# Patient Record
Sex: Male | Born: 1990 | Race: Black or African American | Hispanic: No | Marital: Single | Smoking: Current every day smoker
Health system: Southern US, Community
[De-identification: ages and names within clinical notes are randomized; demographics above are authoritative.]

## PROBLEM LIST (undated history)

## (undated) DIAGNOSIS — J45909 Unspecified asthma, uncomplicated: Secondary | ICD-10-CM

---
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
## 2009-09-06 ENCOUNTER — Emergency Department (HOSPITAL_COMMUNITY): Admission: EM | Admit: 2009-09-06 | Discharge: 2009-09-06 | Payer: Self-pay | Admitting: Emergency Medicine

## 2012-03-03 ENCOUNTER — Emergency Department (HOSPITAL_COMMUNITY)
Admission: EM | Admit: 2012-03-03 | Discharge: 2012-03-03 | Disposition: A | Discharge status: Home / Self Care | Payer: Self-pay | Source: Home / Self Care

## 2014-05-07 ENCOUNTER — Encounter (HOSPITAL_COMMUNITY): Payer: Self-pay | Admitting: Emergency Medicine

## 2014-05-07 ENCOUNTER — Emergency Department (HOSPITAL_COMMUNITY)
Admission: EM | Admit: 2014-05-07 | Discharge: 2014-05-07 | Disposition: A | Discharge status: Home / Self Care | Payer: No Typology Code available for payment source | Attending: Emergency Medicine | Admitting: Emergency Medicine

## 2014-05-07 DIAGNOSIS — Y92410 Unspecified street and highway as the place of occurrence of the external cause: Secondary | ICD-10-CM | POA: Insufficient documentation

## 2014-05-07 DIAGNOSIS — IMO0002 Reserved for concepts with insufficient information to code with codable children: Secondary | ICD-10-CM | POA: Insufficient documentation

## 2014-05-07 DIAGNOSIS — F17200 Nicotine dependence, unspecified, uncomplicated: Secondary | ICD-10-CM | POA: Insufficient documentation

## 2014-05-07 DIAGNOSIS — Y9389 Activity, other specified: Secondary | ICD-10-CM | POA: Insufficient documentation

## 2014-05-07 DIAGNOSIS — M542 Cervicalgia: Secondary | ICD-10-CM

## 2014-05-07 DIAGNOSIS — S199XXA Unspecified injury of neck, initial encounter: Principal | ICD-10-CM

## 2014-05-07 DIAGNOSIS — J45909 Unspecified asthma, uncomplicated: Secondary | ICD-10-CM | POA: Insufficient documentation

## 2014-05-07 DIAGNOSIS — S0993XA Unspecified injury of face, initial encounter: Secondary | ICD-10-CM | POA: Insufficient documentation

## 2014-05-07 DIAGNOSIS — S0990XA Unspecified injury of head, initial encounter: Secondary | ICD-10-CM | POA: Insufficient documentation

## 2014-05-07 HISTORY — DX: Unspecified asthma, uncomplicated: J45.909

## 2014-05-07 MED ORDER — CYCLOBENZAPRINE HCL 10 MG PO TABS: 10.0000 mg | ORAL_TABLET | Freq: Two times a day (BID) | ORAL | Status: AC | PRN

## 2014-05-07 MED ORDER — IBUPROFEN 800 MG PO TABS: 800.0000 mg | ORAL_TABLET | Freq: Three times a day (TID) | ORAL | Status: AC

## 2014-05-07 MED ORDER — IBUPROFEN 400 MG PO TABS
800.0000 mg | ORAL_TABLET | Freq: Once | ORAL | Status: AC
  Administered 2014-05-07: 800 mg via ORAL
  Filled 2014-05-07: qty 2
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
## 2014-05-07 NOTE — Discharge Instructions (Signed)
Take ibuprofen as needed for pain. Take Flexeril as needed for muscle spasm. Refer to attached documents for more information. Return to the ED with worsening or concerning symptoms.  °

## 2014-05-09 NOTE — ED Provider Notes (Signed)
Medical screening examination/treatment/procedure(s) were performed by non-physician practitioner and as supervising physician I was immediately available for consultation/collaboration.   EKG Interpretation None        Kathleen M McManus, DO 05/09/14 1448 

## 2014-05-27 ENCOUNTER — Ambulatory Visit
Admission: RE | Admit: 2014-05-27 | Discharge: 2014-05-27 | Disposition: A | Discharge status: Home / Self Care | Payer: No Typology Code available for payment source | Source: Ambulatory Visit | Attending: Family Medicine | Admitting: Family Medicine

## 2014-05-27 ENCOUNTER — Ambulatory Visit: Payer: No Typology Code available for payment source | Attending: Family Medicine | Admitting: Physical Therapy

## 2014-05-27 ENCOUNTER — Other Ambulatory Visit: Payer: Self-pay | Admitting: Family Medicine

## 2014-05-27 DIAGNOSIS — S242XXA Injury of nerve root of thoracic spine, initial encounter: Secondary | ICD-10-CM

## 2014-05-27 DIAGNOSIS — S145XXA Injury of cervical sympathetic nerves, initial encounter: Secondary | ICD-10-CM

## 2014-05-27 DIAGNOSIS — M545 Low back pain, unspecified: Secondary | ICD-10-CM | POA: Insufficient documentation

## 2014-05-27 DIAGNOSIS — M542 Cervicalgia: Secondary | ICD-10-CM | POA: Insufficient documentation

## 2014-06-01 ENCOUNTER — Ambulatory Visit: Payer: No Typology Code available for payment source | Admitting: Physical Therapy

## 2014-06-02 ENCOUNTER — Ambulatory Visit: Payer: No Typology Code available for payment source | Admitting: Physical Therapy

## 2014-06-09 ENCOUNTER — Ambulatory Visit: Payer: No Typology Code available for payment source | Admitting: Physical Therapy

## 2014-06-10 ENCOUNTER — Ambulatory Visit: Payer: No Typology Code available for payment source | Admitting: Physical Therapy

## 2014-06-15 ENCOUNTER — Ambulatory Visit: Payer: No Typology Code available for payment source | Admitting: Physical Therapy

## 2014-06-22 ENCOUNTER — Ambulatory Visit: Payer: Self-pay | Attending: Family Medicine | Admitting: Physical Therapy

## 2014-06-22 DIAGNOSIS — M545 Low back pain, unspecified: Secondary | ICD-10-CM | POA: Insufficient documentation

## 2014-06-22 DIAGNOSIS — M542 Cervicalgia: Secondary | ICD-10-CM | POA: Insufficient documentation

## 2014-06-24 ENCOUNTER — Ambulatory Visit: Payer: Self-pay | Admitting: Physical Therapy

## 2014-06-29 ENCOUNTER — Ambulatory Visit: Payer: Self-pay | Admitting: Physical Therapy

## 2014-07-01 ENCOUNTER — Ambulatory Visit: Payer: Self-pay | Admitting: Physical Therapy

## 2014-07-06 ENCOUNTER — Ambulatory Visit: Payer: Self-pay | Admitting: Physical Therapy

## 2014-07-07 ENCOUNTER — Ambulatory Visit: Payer: Self-pay | Admitting: Physical Therapy
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
## 2016-03-29 ENCOUNTER — Emergency Department (HOSPITAL_COMMUNITY): Payer: No Typology Code available for payment source

## 2016-03-29 ENCOUNTER — Emergency Department (HOSPITAL_COMMUNITY)
Admission: EM | Admit: 2016-03-29 | Discharge: 2016-03-30 | Disposition: A | Discharge status: Home / Self Care | Payer: No Typology Code available for payment source | Attending: Emergency Medicine | Admitting: Emergency Medicine

## 2016-03-29 DIAGNOSIS — Y998 Other external cause status: Secondary | ICD-10-CM | POA: Diagnosis not present

## 2016-03-29 DIAGNOSIS — J45909 Unspecified asthma, uncomplicated: Secondary | ICD-10-CM | POA: Insufficient documentation

## 2016-03-29 DIAGNOSIS — F17200 Nicotine dependence, unspecified, uncomplicated: Secondary | ICD-10-CM | POA: Diagnosis not present

## 2016-03-29 DIAGNOSIS — S4991XA Unspecified injury of right shoulder and upper arm, initial encounter: Secondary | ICD-10-CM | POA: Insufficient documentation

## 2016-03-29 DIAGNOSIS — S29002A Unspecified injury of muscle and tendon of back wall of thorax, initial encounter: Secondary | ICD-10-CM | POA: Diagnosis not present

## 2016-03-29 DIAGNOSIS — S3992XA Unspecified injury of lower back, initial encounter: Secondary | ICD-10-CM | POA: Diagnosis present

## 2016-03-29 DIAGNOSIS — Y92410 Unspecified street and highway as the place of occurrence of the external cause: Secondary | ICD-10-CM | POA: Insufficient documentation

## 2016-03-29 DIAGNOSIS — M5441 Lumbago with sciatica, right side: Secondary | ICD-10-CM | POA: Insufficient documentation

## 2016-03-29 DIAGNOSIS — Y9389 Activity, other specified: Secondary | ICD-10-CM | POA: Insufficient documentation

## 2016-03-29 DIAGNOSIS — Z791 Long term (current) use of non-steroidal anti-inflammatories (NSAID): Secondary | ICD-10-CM | POA: Diagnosis not present

## 2016-03-29 MED ORDER — TRAMADOL HCL 50 MG PO TABS: 50.0000 mg | ORAL_TABLET | Freq: Four times a day (QID) | ORAL | Status: DC | PRN

## 2016-03-29 MED ORDER — CYCLOBENZAPRINE HCL 10 MG PO TABS: 5.0000 mg | ORAL_TABLET | Freq: Two times a day (BID) | ORAL | Status: AC | PRN

## 2016-03-29 MED ORDER — TRAMADOL HCL 50 MG PO TABS
50.0000 mg | ORAL_TABLET | Freq: Once | ORAL | Status: AC
  Administered 2016-03-29: 50 mg via ORAL
  Filled 2016-03-29: qty 1

## 2016-03-29 MED ORDER — CYCLOBENZAPRINE HCL 10 MG PO TABS
5.0000 mg | ORAL_TABLET | Freq: Once | ORAL | Status: AC
  Administered 2016-03-29: 5 mg via ORAL
  Filled 2016-03-29: qty 1

## 2016-03-29 MED ORDER — NAPROXEN 500 MG PO TABS: 500.0000 mg | ORAL_TABLET | Freq: Two times a day (BID) | ORAL | Status: AC
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
## 2016-03-29 NOTE — ED Notes (Signed)
Pt ambulated to Xray 

## 2016-03-29 NOTE — ED Notes (Signed)
Pt instructed to change for XR
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
## 2018-11-02 ENCOUNTER — Emergency Department (HOSPITAL_COMMUNITY): Payer: Self-pay

## 2018-11-02 ENCOUNTER — Emergency Department (HOSPITAL_COMMUNITY)
Admission: EM | Admit: 2018-11-02 | Discharge: 2018-11-02 | Disposition: A | Discharge status: Home / Self Care | Payer: Self-pay | Attending: Emergency Medicine | Admitting: Emergency Medicine

## 2018-11-02 ENCOUNTER — Ambulatory Visit (HOSPITAL_COMMUNITY)
Admission: EM | Admit: 2018-11-02 | Discharge: 2018-11-02 | Discharge status: Against Medical Advice | Payer: No Typology Code available for payment source

## 2018-11-02 ENCOUNTER — Encounter (HOSPITAL_COMMUNITY): Payer: Self-pay

## 2018-11-02 DIAGNOSIS — N433 Hydrocele, unspecified: Secondary | ICD-10-CM | POA: Insufficient documentation

## 2018-11-02 DIAGNOSIS — J45909 Unspecified asthma, uncomplicated: Secondary | ICD-10-CM | POA: Insufficient documentation

## 2018-11-02 DIAGNOSIS — I861 Scrotal varices: Secondary | ICD-10-CM | POA: Insufficient documentation

## 2018-11-02 DIAGNOSIS — F17210 Nicotine dependence, cigarettes, uncomplicated: Secondary | ICD-10-CM | POA: Insufficient documentation

## 2018-11-02 DIAGNOSIS — N5089 Other specified disorders of the male genital organs: Secondary | ICD-10-CM

## 2018-11-02 MED ORDER — TRAMADOL HCL 50 MG PO TABS: 50.0000 mg | ORAL_TABLET | Freq: Four times a day (QID) | ORAL | 0 refills | Status: AC | PRN

## 2018-11-02 MED ORDER — MORPHINE SULFATE (PF) 4 MG/ML IV SOLN
4.0000 mg | Freq: Once | INTRAVENOUS | Status: AC
  Administered 2018-11-02: 4 mg via INTRAVENOUS
  Filled 2018-11-02: qty 1

## 2018-11-02 NOTE — ED Triage Notes (Addendum)
Patient c/o of left testical swelling. Patient feels as if they are twisted. Patient noticed swelling yesterday that has gotten worse.    7/10 tightness/pressure   A/Ox4.  Ambulatory in triage.

## 2018-11-02 NOTE — Discharge Instructions (Signed)
No heavy lifting for 2-3 days   Take motrin for pain  Take tramadol for severe pain   See urology for follow up  Return to ER if you have worse scrotal pain or swelling, abdominal pain, vomiting, fever

## 2018-11-02 NOTE — ED Provider Notes (Signed)
[facility] Provider Note   CSN: [id number] Arrival date & time: 11/02/18  1313     History   Chief Complaint Chief Complaint  Patient presents with  . Groin Swelling    HPI [facility] is a 27 y.o. male history of asthma here presenting with left testicular pain.  Patient states that he had left testicular pain that started gradually sometime yesterday.  He did left up some heavy equipment at work.  Patient states that overnight it got worse and is very swollen.  Denies any pain with urination or hematuria or dysuria.  Patient states that he is sexually active with 1 male partner and was using condoms about 4 days ago.  Patient states that his son had testicular torsion previously but has no personal history of testicular torsion.  He is concerned that he may have testicular torsion currently. Denies any flank pain or hx of kidney stones. Denies any penile discharge   The history is provided by the patient.    Past Medical History:  Diagnosis Date  . Asthma     There are no active problems to display for this patient.   History reviewed. No pertinent surgical history.      Home Medications    Prior to Admission medications   Medication Sig Start Date End Date Taking? Authorizing Provider  Naproxen Sodium (ALEVE) 220 MG CAPS Take 440 mg by mouth daily as needed (pain).   Yes [provider]  cyclobenzaprine (FLEXERIL) 10 MG tablet Take 1 tablet (10 mg total) by mouth 2 (two) times daily as needed for muscle spasms. Patient not taking: Reported on 11/02/2018 05/07/14   Troy Beck, PA-C  cyclobenzaprine (FLEXERIL) 10 MG tablet Take 0.5-1 tablets (5-10 mg total) by mouth 2 (two) times daily as needed for muscle spasms. Patient not taking: Reported on 11/02/2018 03/29/16   Troy Pel, PA-C  ibuprofen (ADVIL,MOTRIN) 800 MG tablet Take 1 tablet (800 mg total) by mouth 3 (three) times daily. Patient not taking:  Reported on 11/02/2018 05/07/14   Troy Beck, PA-C  naproxen (NAPROSYN) 500 MG tablet Take 1 tablet (500 mg total) by mouth 2 (two) times daily. Patient not taking: Reported on 11/02/2018 03/29/16   Troy Pel, PA-C  traMADol (ULTRAM) 50 MG tablet Take 1 tablet (50 mg total) by mouth every 6 (six) hours as needed. Patient not taking: Reported on 11/02/2018 03/29/16   Troy Pel, PA-C    Family History History reviewed. No pertinent family history.  Social History Social History   Tobacco Use  . Smoking status: Current Every Day Smoker  . Smokeless tobacco: Never Used  Substance Use Topics  . Alcohol use: Yes    Comment: socially  . Drug use: Yes    Types: Marijuana     Allergies   Peach [prunus persica]   Review of Systems Review of Systems  Genitourinary: Positive for scrotal swelling.  All other systems reviewed and are negative.    Physical Exam Updated Vital Signs BP 121/70   Pulse 90   Temp 98 F (36.7 C)   Resp 20   SpO2 94%   Physical Exam Vitals signs and nursing note reviewed.  Constitutional:      Comments: Uncomfortable, anxious   HENT:     Head: Normocephalic.     Nose: Nose normal.     Mouth/Throat:     Mouth: Mucous membranes are moist.  Eyes:     Extraocular Movements: Extraocular movements intact.  Pupils: Pupils are equal, round, and reactive to light.  Neck:     Musculoskeletal: Normal range of motion.  Cardiovascular:     Rate and Rhythm: Normal rate.  Pulmonary:     Effort: Pulmonary effort is normal.     Breath sounds: Normal breath sounds.  Abdominal:     General: Abdomen is flat. Bowel sounds are normal.     Palpations: Abdomen is soft.  Genitourinary:    Penis: Normal.      Comments: L testicle swollen and mildly tender. ? Varicocele, no obvious inguinal hernia. Difficult to assess cremasteric reflex due to swelling  Musculoskeletal: Normal range of motion.  Skin:    General: Skin is warm.     Capillary  Refill: Capillary refill takes less than 2 seconds.  Neurological:     General: No focal deficit present.     Mental Status: He is alert and oriented to person, place, and time. Mental status is at baseline.  Psychiatric:        Mood and Affect: Mood normal.      ED Treatments / Results  Labs (all labs ordered are listed, but only abnormal results are displayed) Labs Reviewed - No data to display  EKG None  Radiology [place] Scrotum W/doppler  Result Date: 11/02/2018 CLINICAL DATA:  Left testicular pain. EXAM: SCROTAL ULTRASOUND DOPPLER ULTRASOUND OF THE TESTICLES TECHNIQUE: Complete ultrasound examination of the testicles, epididymis, and other scrotal structures was performed. Color and spectral Doppler ultrasound were also utilized to evaluate blood flow to the testicles. COMPARISON:  None. FINDINGS: Right testicle Measurements: 4.7 x 2.1 x 2.9 cm. No mass or microlithiasis visualized. Left testicle Measurements: 4.3 x 2.5 x 2.2 cm. No mass or microlithiasis visualized. Right epididymis:  Normal in size and appearance. Left epididymis:  Normal in size and appearance. Hydrocele:  Small bilateral hydroceles are noted. Varicocele:  Mild bilateral varicoceles are noted. Pulsed Doppler interrogation of both testes demonstrates normal low resistance arterial and venous waveforms bilaterally. IMPRESSION: No evidence of testicular mass or torsion. Small bilateral hydroceles are noted. Mild bilateral varicoceles are noted. Electronically Signed   By: [facility]  Green Jr, M.D.   On: 11/02/2018 15:17    Procedures Procedures (including critical care time)  Medications Ordered in ED Medications  morphine 4 MG/ML injection 4 mg (4 mg Intravenous Given 11/02/18 1355)     Initial Impression / Assessment and Plan / ED Course  I have reviewed the triage vital signs and the nursing notes.  Pertinent labs & imaging results that were available during my care of the patient were reviewed by me and  considered in my medical decision making (see chart for details).    Lia HoppingBrandon F Strong is a 27 y.o. male here with L testicular pain and swelling. Consider torsion vs varicocele (given recent heavy lifting), no obvious inguinal hernia palpable. Will get scrotal US to r/o torsion    4:03 PM US showed no torsion. There is bilateral hydroceles and varicoceles. I think that is likely what is causing his pain. Will have him not lift up heavy items, prn motrin, vicodin for pain, urology follow up   Final Clinical Impressions(s) / ED Diagnoses   Final diagnoses:  None    ED Discharge Orders    None       Charlynne PanderYao, Dennis Killilea Hsienta, MD 11/02/18 [phone]

## 2018-11-02 NOTE — ED Notes (Signed)
Patient transported to Ultrasound 

## 2018-11-11 ENCOUNTER — Emergency Department (HOSPITAL_COMMUNITY): Payer: Self-pay

## 2018-11-11 ENCOUNTER — Emergency Department (HOSPITAL_COMMUNITY)
Admission: EM | Admit: 2018-11-11 | Discharge: 2018-11-11 | Disposition: A | Discharge status: Home / Self Care | Payer: Self-pay | Attending: Emergency Medicine | Admitting: Emergency Medicine

## 2018-11-11 ENCOUNTER — Encounter (HOSPITAL_COMMUNITY): Payer: Self-pay | Admitting: Emergency Medicine

## 2018-11-11 DIAGNOSIS — J45909 Unspecified asthma, uncomplicated: Secondary | ICD-10-CM | POA: Insufficient documentation

## 2018-11-11 DIAGNOSIS — N451 Epididymitis: Secondary | ICD-10-CM | POA: Insufficient documentation

## 2018-11-11 DIAGNOSIS — N452 Orchitis: Secondary | ICD-10-CM | POA: Insufficient documentation

## 2018-11-11 DIAGNOSIS — F17200 Nicotine dependence, unspecified, uncomplicated: Secondary | ICD-10-CM | POA: Insufficient documentation

## 2018-11-11 LAB — BASIC METABOLIC PANEL
ANION GAP: 13 (ref 5–15)
BUN: 10 mg/dL (ref 6–20)
CO2: 22 mmol/L (ref 22–32)
Calcium: 9.4 mg/dL (ref 8.9–10.3)
Chloride: 98 mmol/L (ref 98–111)
Creatinine, Ser: 1.13 mg/dL (ref 0.61–1.24)
GFR calc Af Amer: >60 mL/min (ref >60)
GFR calc non Af Amer: >60 mL/min (ref >60)
GLUCOSE: 108 mg/dL — AB (ref 70–99)
POTASSIUM: 3.8 mmol/L (ref 3.5–5.1)
Sodium: 133 mmol/L — ABNORMAL LOW (ref 135–145)

## 2018-11-11 LAB — CBC
HEMATOCRIT: 47.4 % (ref 39.0–52.0)
HEMOGLOBIN: 15.6 g/dL (ref 13.0–17.0)
MCH: 30.1 pg (ref 26.0–34.0)
MCHC: 32.9 g/dL (ref 30.0–36.0)
MCV: 91.3 fL (ref 80.0–100.0)
NRBC: 0 % (ref 0.0–0.2)
Platelets: 356 10*3/uL (ref 150–400)
RBC: 5.19 MIL/uL (ref 4.22–5.81)
RDW: 11.9 % (ref 11.5–15.5)
WBC: 31.3 10*3/uL — ABNORMAL HIGH (ref 4.0–10.5)

## 2018-11-11 MED ORDER — MORPHINE SULFATE (PF) 4 MG/ML IV SOLN
4.0000 mg | Freq: Once | INTRAVENOUS | Status: AC
Start: 2018-11-11 — End: 2018-11-11
  Administered 2018-11-11: 4 mg via INTRAVENOUS
  Filled 2018-11-11: qty 1

## 2018-11-11 MED ORDER — HYDROCODONE-ACETAMINOPHEN 5-325 MG PO TABS: 1.0000 | ORAL_TABLET | Freq: Four times a day (QID) | ORAL | 0 refills | Status: DC | PRN

## 2018-11-11 MED ORDER — HYDROCODONE-ACETAMINOPHEN 5-325 MG PO TABS: 1.0000 | ORAL_TABLET | Freq: Four times a day (QID) | ORAL | 0 refills | Status: AC | PRN

## 2018-11-11 MED ORDER — DOXYCYCLINE HYCLATE 100 MG PO TABS: 100.0000 mg | ORAL_TABLET | Freq: Two times a day (BID) | ORAL | 0 refills | Status: AC

## 2018-11-11 MED ORDER — DOXYCYCLINE HYCLATE 100 MG PO TABS
100.0000 mg | ORAL_TABLET | Freq: Once | ORAL | Status: AC
  Administered 2018-11-11: 100 mg via ORAL
  Filled 2018-11-11: qty 1

## 2018-11-11 MED ORDER — SODIUM CHLORIDE 0.9 % IV SOLN
1.0000 g | Freq: Once | INTRAVENOUS | Status: AC
  Administered 2018-11-11: 1 g via INTRAVENOUS
  Filled 2018-11-11: qty 10

## 2018-11-11 MED ORDER — MORPHINE SULFATE (PF) 4 MG/ML IV SOLN
4.0000 mg | Freq: Once | INTRAVENOUS | Status: AC
  Administered 2018-11-11: 4 mg via INTRAVENOUS
  Filled 2018-11-11: qty 1

## 2018-11-11 NOTE — ED Triage Notes (Signed)
Pt was seen here week or so ago for testicle swelling and hydrocele pt c/o pains in abd now. Denies any antibiotics use. Reports swelling decreased slightly.

## 2018-11-11 NOTE — Discharge Instructions (Addendum)
Take the antibiotics as prescribed, follow-up with urologist sure the infection clears, return to the emergency room for fever, vomiting, worsening symptoms.  You can also take Naprosyn or ibuprofen in addition to the hydrocodone for pain

## 2018-11-11 NOTE — ED Provider Notes (Signed)
[facility]-EMERGENCY DEPT Provider Note   CSN: [id number] Arrival date & time: 11/11/18  [id number]     History   Chief Complaint Chief Complaint  Patient presents with  . Testicle Pain  . Abdominal Pain    HPI [facility] is a 27 y.o. male.  HPI Patient presents to the emergency room for evaluation of persistent left testicle pain and swelling.  Patient was seen in the emergency room on December 15 for the same complaints.  Patient states initially the symptoms started about a day before that visit.  He noticed that his left testicle started hurting and gradually was increasing in severity.  He denied any dysuria or hematuria.  No discharge.  Patient was seen in the emergency room and had a scrotal ultrasound.  It showed mild bilateral hydroceles and varicoceles but no evidence of torsion or testicular abnormality.  States he was referred to urology.  He was given a prescription for Motrin and Vicodin.  Patient states he called urology but they cannot see him until after the holidays.  His symptoms have been increasing in severity.  For the last few days he has not been able to sleep because of the pain.  He has had some intermittent nausea. Past Medical History:  Diagnosis Date  . Asthma     There are no active problems to display for this patient.   History reviewed. No pertinent surgical history.      Home Medications    Prior to Admission medications   Medication Sig Start Date End Date Taking? Authorizing Provider  ibuprofen (ADVIL,MOTRIN) 200 MG tablet Take 600 mg by mouth every 6 (six) hours as needed (pain.).   Yes [provider]  naphazoline-glycerin (CLEAR EYES REDNESS) 0.012-0.2 % SOLN Place 1-2 drops into both eyes 4 (four) times daily as needed for eye irritation.   Yes [provider]  cyclobenzaprine (FLEXERIL) 10 MG tablet Take 1 tablet (10 mg total) by mouth 2 (two) times daily as needed for muscle spasms. Patient not  taking: Reported on 11/02/2018 05/07/14   Emilia BeckSzekalski, Kaitlyn, PA-C  cyclobenzaprine (FLEXERIL) 10 MG tablet Take 0.5-1 tablets (5-10 mg total) by mouth 2 (two) times daily as needed for muscle spasms. Patient not taking: Reported on 11/02/2018 03/29/16   Marlon PelGreene, Tiffany, PA-C  doxycycline (VIBRA-TABS) 100 MG tablet Take 1 tablet (100 mg total) by mouth 2 (two) times daily for 14 days. 11/11/18 11/25/18  Linwood DibblesKnapp, Claudeen Leason, MD  HYDROcodone-acetaminophen (NORCO/VICODIN) 5-325 MG tablet Take 1 tablet by mouth every 6 (six) hours as needed. 11/11/18   Linwood DibblesKnapp, Takisha Pelle, MD  ibuprofen (ADVIL,MOTRIN) 800 MG tablet Take 1 tablet (800 mg total) by mouth 3 (three) times daily. Patient not taking: Reported on 11/02/2018 05/07/14   Emilia BeckSzekalski, Kaitlyn, PA-C  naproxen (NAPROSYN) 500 MG tablet Take 1 tablet (500 mg total) by mouth 2 (two) times daily. Patient not taking: Reported on 11/02/2018 03/29/16   Marlon PelGreene, Tiffany, PA-C  traMADol (ULTRAM) 50 MG tablet Take 1 tablet (50 mg total) by mouth every 6 (six) hours as needed. Patient not taking: Reported on 11/11/2018 11/02/18   Charlynne PanderYao, David Hsienta, MD    Family History No family history on file.  Social History Social History   Tobacco Use  . Smoking status: Current Every Day Smoker  . Smokeless tobacco: Never Used  Substance Use Topics  . Alcohol use: Yes    Comment: socially  . Drug use: Yes    Types: Marijuana  Allergies   Peach [prunus persica]   Review of Systems Review of Systems  All other systems reviewed and are negative.    Physical Exam Updated Vital Signs BP 139/83   Pulse 95   Temp 98.3 F (36.8 C) (Oral)   Resp 16   SpO2 99%   Physical Exam Vitals signs and nursing note reviewed.  Constitutional:      General: He is not in acute distress.    Appearance: He is well-developed.  HENT:     Head: Normocephalic and atraumatic.     Right Ear: External ear normal.     Left Ear: External ear normal.  Eyes:     General: No scleral  icterus.       Right eye: No discharge.        Left eye: No discharge.     Conjunctiva/sclera: Conjunctivae normal.  Neck:     Musculoskeletal: Neck supple.     Trachea: No tracheal deviation.  Cardiovascular:     Rate and Rhythm: Normal rate and regular rhythm.  Pulmonary:     Effort: Pulmonary effort is normal. No respiratory distress.     Breath sounds: Normal breath sounds. No stridor. No wheezing or rales.  Abdominal:     General: Bowel sounds are normal. There is no distension.     Palpations: Abdomen is soft.     Tenderness: There is no abdominal tenderness. There is no guarding or rebound.     Hernia: There is no hernia in the left inguinal area.  Genitourinary:    Pubic Area: No rash.      Penis: Normal.      Scrotum/Testes:        Left: Mass, tenderness and swelling present.     Epididymis:     Left: Enlarged. Tenderness present.  Musculoskeletal:        General: No tenderness.  Skin:    General: Skin is warm and dry.     Findings: No rash.  Neurological:     Mental Status: He is alert.     Cranial Nerves: No cranial nerve deficit (no facial droop, extraocular movements intact, no slurred speech).     Sensory: No sensory deficit.     Motor: No abnormal muscle tone or seizure activity.     Coordination: Coordination normal.      ED Treatments / Results  Labs (all labs ordered are listed, but only abnormal results are displayed) Labs Reviewed  CBC - Abnormal; Notable for the following components:      Result Value   WBC 31.3 (*)    All other components within normal limits  BASIC METABOLIC PANEL - Abnormal; Notable for the following components:   Sodium 133 (*)    Glucose, Bld 108 (*)    All other components within normal limits  URINALYSIS, ROUTINE W REFLEX MICROSCOPIC    EKG None  Radiology US Scrotum W/doppler  Result Date: 11/11/2018 CLINICAL DATA:  Left scrotal region swelling EXAM: SCROTAL ULTRASOUND DOPPLER ULTRASOUND OF THE TESTICLES  TECHNIQUE: Complete ultrasound examination of the testicles, epididymis, and other scrotal structures was performed. Color and spectral Doppler ultrasound were also utilized to evaluate blood flow to the testicles. COMPARISON:  November 02, 2018 FINDINGS: Right testicle Measurements: 4.7 x 2.2 x 2.5 cm. No mass or microlithiasis visualized. Left testicle Measurements: 4.5 x 3.1 x 3.2 cm. No mass or microlithiasis visualized. There appears to be a degree of increase in blood flow on the left. Right epididymis: Normal  in size and appearance. No inflammatory focus evident. Left epididymis: The left epididymis appears enlarged and edematous with increased vascularity, felt to represent epididymitis. Hydrocele: There is a small hydrocele on the left which contains areas of septation, likely due to inflammation. Varicocele:  None visualized. Pulsed Doppler interrogation of both testes demonstrates normal low resistance arterial and venous waveforms bilaterally. No scrotal abscess or scrotal wall thickening. IMPRESSION: 1. Findings felt to be indicative of left-sided epididymitis and orchitis. Note that the left epididymis is edematous and enlarged compared to the normal-appearing right side with increase in vascular flow. A lesser dramatic degree of increased testicular flow on the left noted compared to the right. 2. Small mildly complex hydrocele on the left, likely due to the surrounding inflammatory changes. 3. No abnormality appreciable on the right side. Note that there is no testicular torsion on either side. Electronically Signed   By: Bretta BangWilliam  Woodruff III M.D.   On: 11/11/2018 11:11    Procedures Procedures (including critical care time)  Medications Ordered in ED Medications  morphine 4 MG/ML injection 4 mg (4 mg Intravenous Given 11/11/18 0945)  cefTRIAXone (ROCEPHIN) 1 g in sodium chloride 0.9 % 100 mL IVPB (0 g Intravenous Stopped 11/11/18 1250)  doxycycline (VIBRA-TABS) tablet 100 mg (100 mg Oral  Given 11/11/18 1156)  morphine 4 MG/ML injection 4 mg (4 mg Intravenous Given 11/11/18 1156)     Initial Impression / Assessment and Plan / ED Course  I have reviewed the triage vital signs and the nursing notes.  Pertinent labs & imaging results that were available during my care of the patient were reviewed by me and considered in my medical decision making (see chart for details).  Clinical Course as of Nov 12 1343  Tue Nov 11, 2018  0940 I reviewed the previous ultrasound findings.  His exam today seems to be out of proportion to the previous ultrasound findings.  We will get a repeat ultrasound today   [JK]    Clinical Course User Index [JK] Linwood DibblesKnapp, Breyer Tejera, MD    Patient presented to the emergency room for persistent testicle pain.  He was seen in the emergency room on the 15th.  He was told the symptoms were related to varicoceles.  Patient symptoms have progressed.  On exam he had significant erythema and tenderness of the left testicle.  Laboratory test shows a significantly elevated white blood cell count.  Patient is afebrile and does not appear toxic or septic.  His ultrasound does confirm an epididymitis/orchitis.  He was treated with an IV dose of antibiotics and pain medications.  Patient does not have any signs of systemic infection.  I did offer hospitalization because of his pain. He does not want to stay in the hospital over Christmas. I think he is stable for outpatient treatment.  Final Clinical Impressions(s) / ED Diagnoses   Final diagnoses:  Epididymitis  Orchitis    ED Discharge Orders         Ordered    HYDROcodone-acetaminophen (NORCO/VICODIN) 5-325 MG tablet  Every 6 hours PRN,   Status:  Discontinued     11/11/18 1342    doxycycline (VIBRA-TABS) 100 MG tablet  2 times daily     11/11/18 1342    HYDROcodone-acetaminophen (NORCO/VICODIN) 5-325 MG tablet  Every 6 hours PRN     11/11/18 1344           Linwood DibblesKnapp, Tanise Russman, MD 11/11/18 1348

## 2018-11-11 NOTE — ED Notes (Signed)
Pt. Aware of urine specimen. Will collect urine when pt.voids. Nurse aware.  

## 2020-03-04 ENCOUNTER — Emergency Department (HOSPITAL_COMMUNITY): Payer: Self-pay

## 2020-03-04 ENCOUNTER — Emergency Department (HOSPITAL_COMMUNITY)
Admission: EM | Admit: 2020-03-04 | Discharge: 2020-03-04 | Disposition: A | Discharge status: Home / Self Care | Payer: Self-pay | Attending: Emergency Medicine | Admitting: Emergency Medicine

## 2020-03-04 ENCOUNTER — Encounter (HOSPITAL_COMMUNITY): Payer: Self-pay

## 2020-03-04 ENCOUNTER — Other Ambulatory Visit: Payer: Self-pay

## 2020-03-04 DIAGNOSIS — M549 Dorsalgia, unspecified: Secondary | ICD-10-CM | POA: Insufficient documentation

## 2020-03-04 DIAGNOSIS — R52 Pain, unspecified: Secondary | ICD-10-CM

## 2020-03-04 DIAGNOSIS — F17210 Nicotine dependence, cigarettes, uncomplicated: Secondary | ICD-10-CM | POA: Diagnosis not present

## 2020-03-04 DIAGNOSIS — M25512 Pain in left shoulder: Secondary | ICD-10-CM | POA: Insufficient documentation

## 2020-03-04 DIAGNOSIS — Z79899 Other long term (current) drug therapy: Secondary | ICD-10-CM | POA: Insufficient documentation

## 2020-03-04 DIAGNOSIS — F1210 Cannabis abuse, uncomplicated: Secondary | ICD-10-CM | POA: Diagnosis not present

## 2020-03-04 DIAGNOSIS — M7918 Myalgia, other site: Secondary | ICD-10-CM

## 2020-03-04 MED ORDER — METHOCARBAMOL 750 MG PO TABS: 750.0000 mg | ORAL_TABLET | Freq: Every evening | ORAL | 0 refills | Status: AC | PRN

## 2020-03-04 NOTE — Discharge Instructions (Addendum)
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
## 2020-04-06 IMAGING — US US SCROTUM W/ DOPPLER COMPLETE
1 series · 14 of 25 positions shown · non-contrast
Comparison: None.

CLINICAL DATA: Left testicular pain.

EXAM:
SCROTAL ULTRASOUND
DOPPLER ULTRASOUND OF THE TESTICLES
TECHNIQUE: Complete ultrasound examination of the testicles, epididymis, and
other scrotal structures was performed. Color and spectral Doppler
ultrasound were also utilized to evaluate blood flow to the
testicles.

[Series 1: us scrotum w/ doppler complete · 14 of 73 slices shown]
[im 1/73]
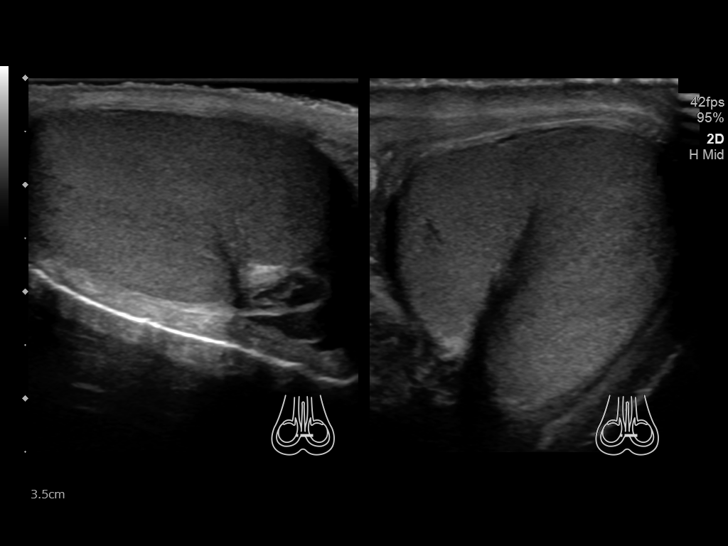
[im 7/73]
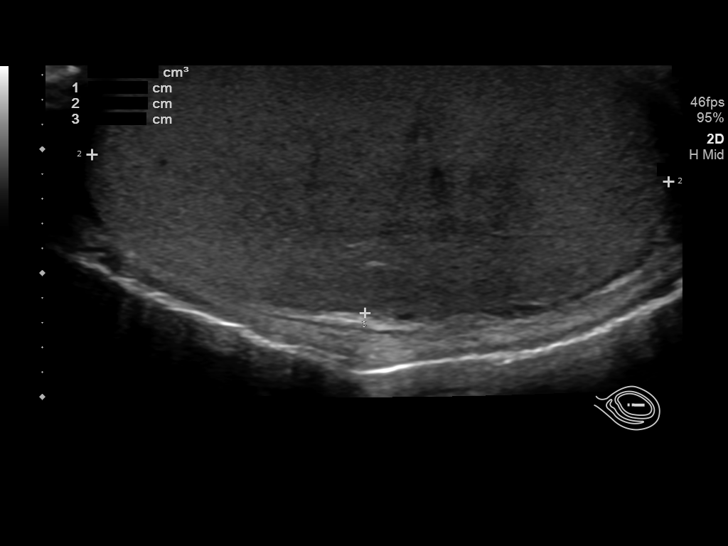
[im 13/73]
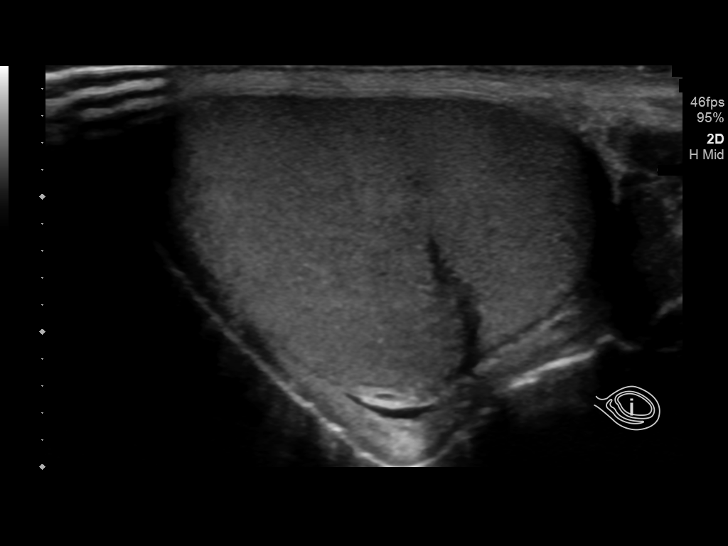
[im 19/73]
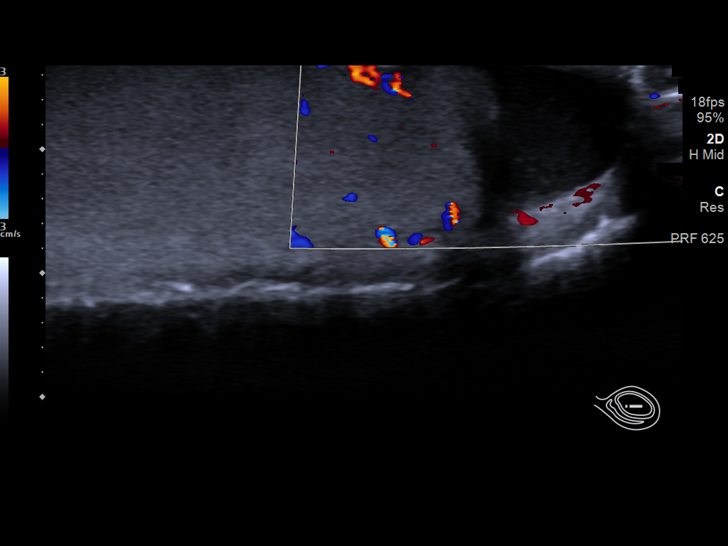
[im 25/73]
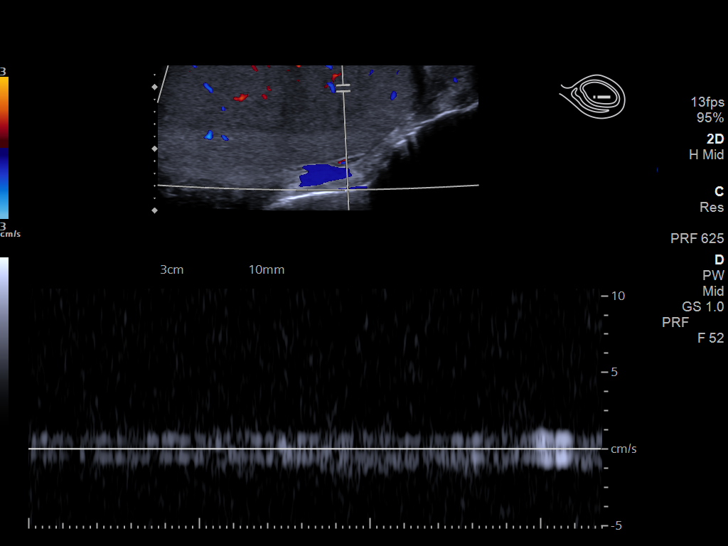
[im 28/73]
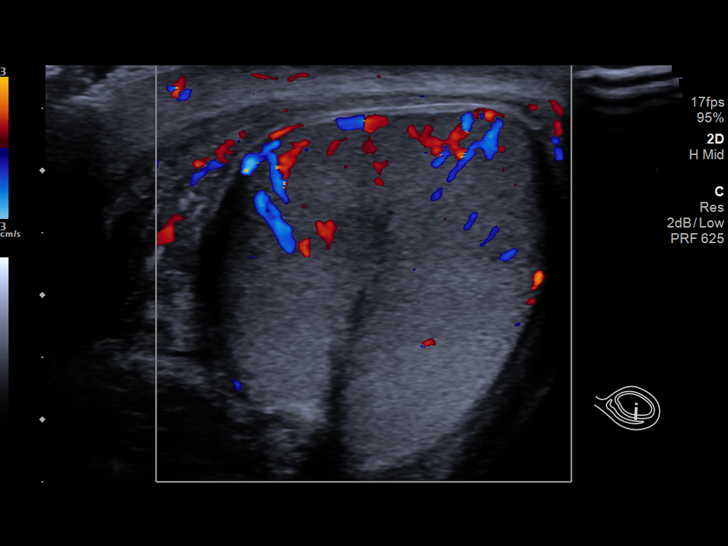
[im 34/73]
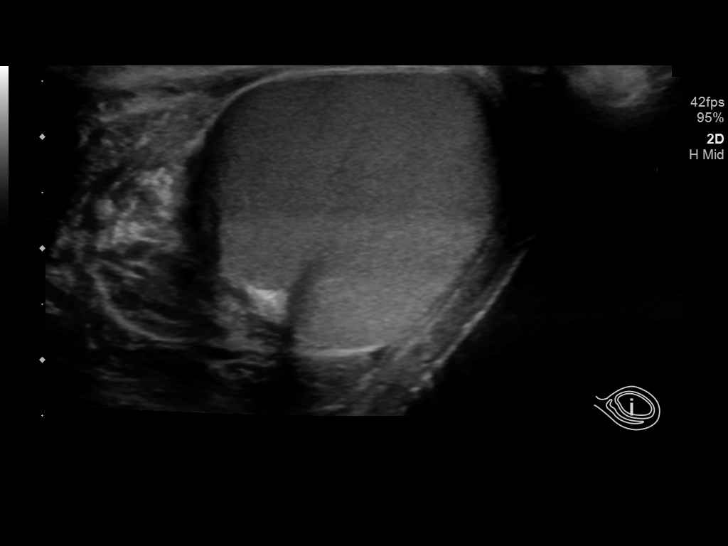
[im 40/73]
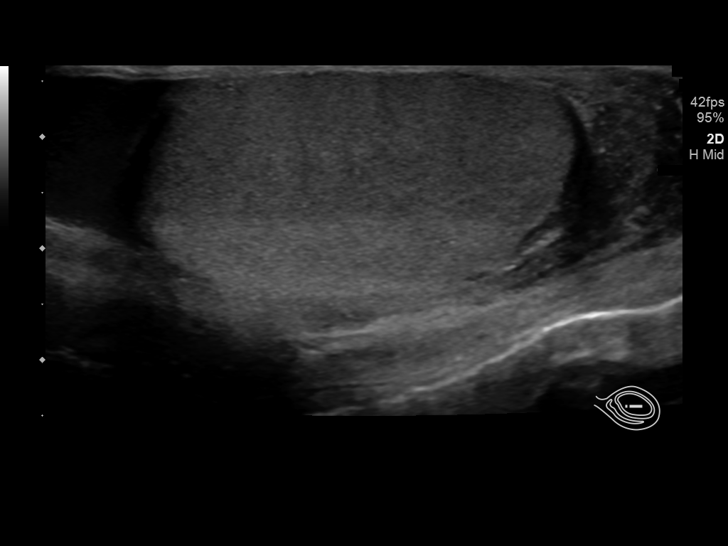
[im 46/73]
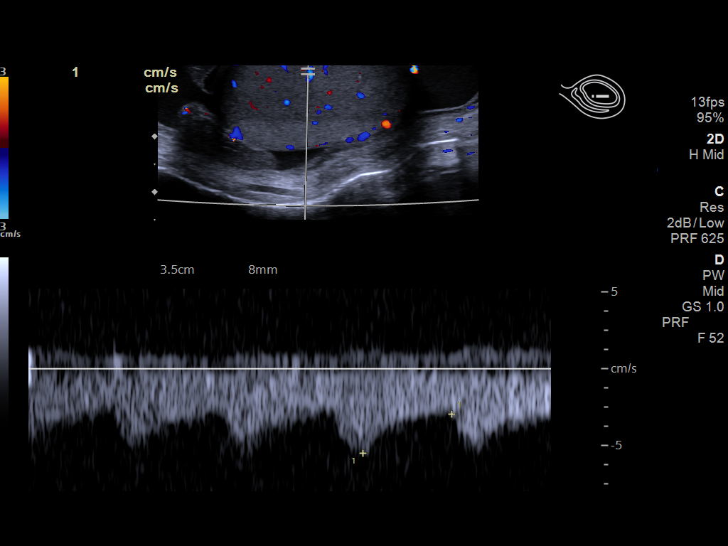
[im 49/73]
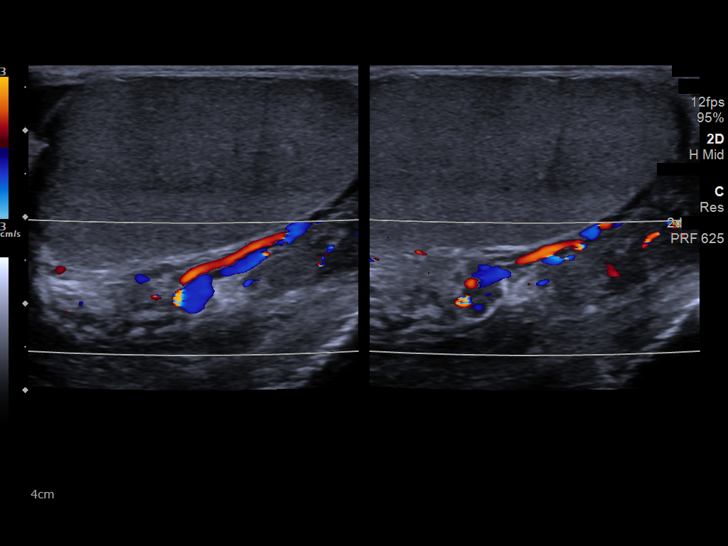
[im 55/73]
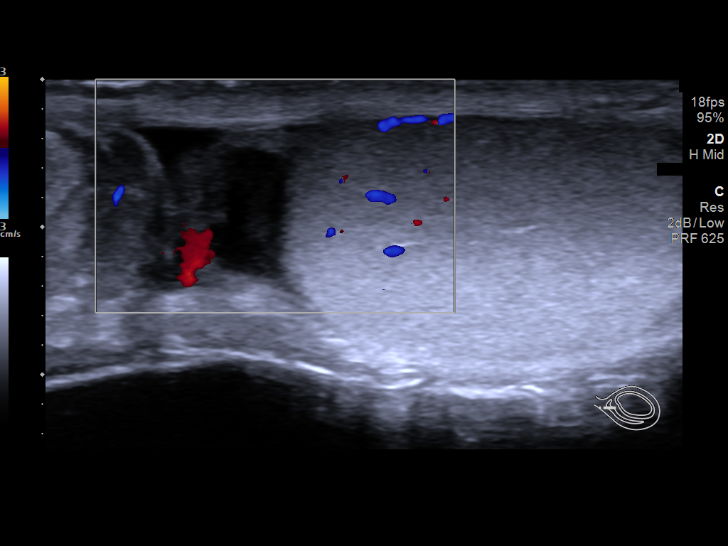
[im 61/73]
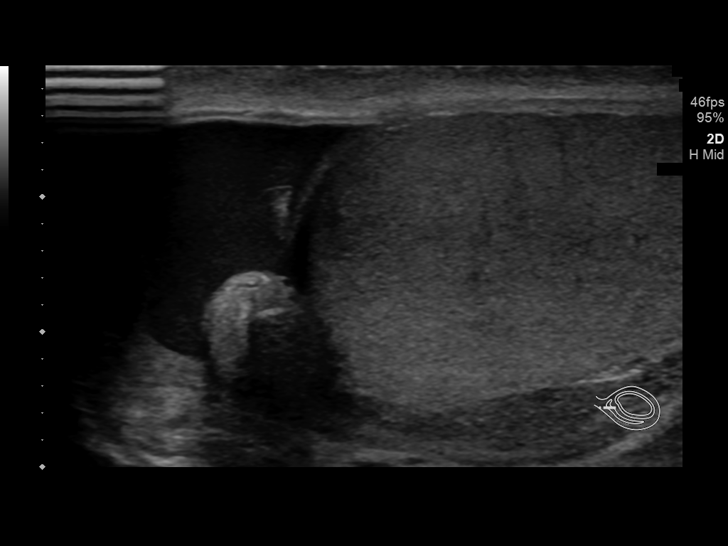
[im 67/73]
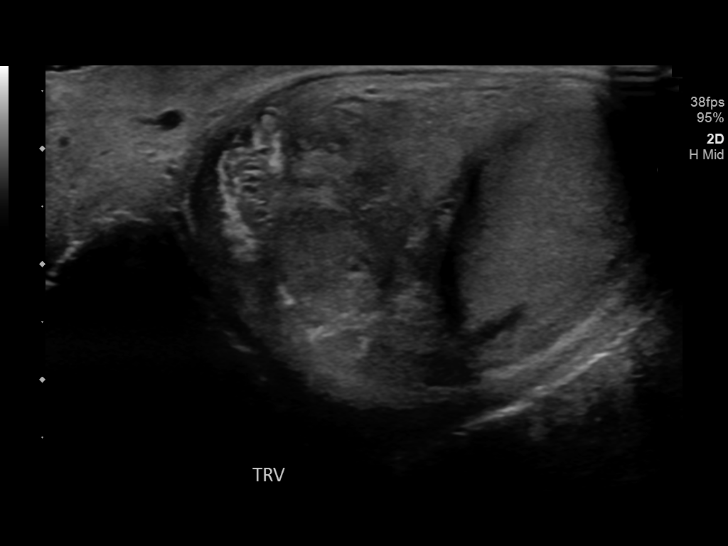
[im 73/73]
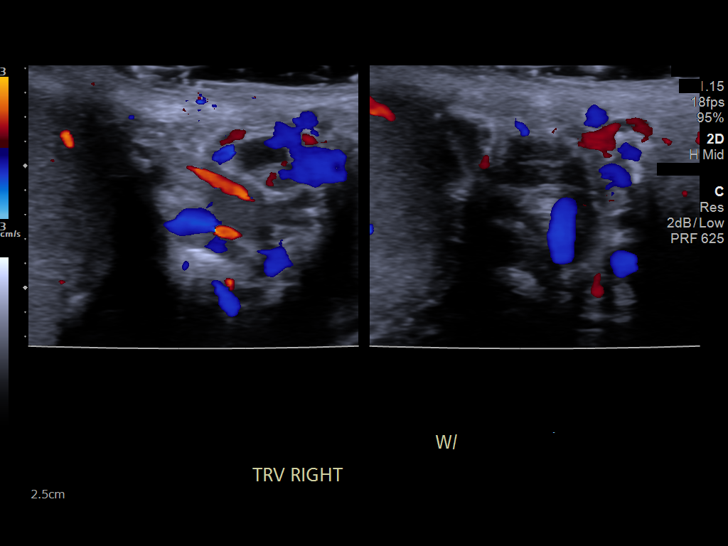

[14 of 25 positions shown; findings below may reference images not displayed]

FINDINGS: Right testicle

Measurements: 4.7 x 2.1 x 2.9 cm. No mass or microlithiasis
visualized.

Left testicle

Measurements: 4.3 x 2.5 x 2.2 cm. No mass or microlithiasis
visualized.

Right epididymis:  Normal in size and appearance.

Left epididymis:  Normal in size and appearance.

Hydrocele:  Small bilateral hydroceles are noted.

Varicocele:  Mild bilateral varicoceles are noted.

Pulsed Doppler interrogation of both testes demonstrates normal low
resistance arterial and venous waveforms bilaterally.
IMPRESSION: No evidence of testicular mass or torsion.

Small bilateral hydroceles are noted.

Mild bilateral varicoceles are noted.

## 2022-04-28 ENCOUNTER — Emergency Department (HOSPITAL_COMMUNITY)
Admission: EM | Admit: 2022-04-28 | Discharge: 2022-04-28 | Disposition: A | Discharge status: Other Acute Inpatient Hospital | Payer: Self-pay | Source: Home / Self Care | Attending: Emergency Medicine | Admitting: Emergency Medicine

## 2022-04-28 ENCOUNTER — Emergency Department (HOSPITAL_COMMUNITY)
Admission: EM | Admit: 2022-04-28 | Discharge: 2022-04-28 | Disposition: A | Discharge status: Home / Self Care | Payer: Self-pay | Attending: Emergency Medicine | Admitting: Emergency Medicine

## 2022-04-28 ENCOUNTER — Encounter (HOSPITAL_COMMUNITY): Payer: Self-pay | Admitting: Emergency Medicine

## 2022-04-28 ENCOUNTER — Emergency Department (HOSPITAL_COMMUNITY): Payer: Self-pay

## 2022-04-28 ENCOUNTER — Other Ambulatory Visit: Payer: Self-pay

## 2022-04-28 DIAGNOSIS — R0789 Other chest pain: Secondary | ICD-10-CM | POA: Insufficient documentation

## 2022-04-28 DIAGNOSIS — Z5321 Procedure and treatment not carried out due to patient leaving prior to being seen by health care provider: Secondary | ICD-10-CM | POA: Insufficient documentation

## 2022-04-28 DIAGNOSIS — M542 Cervicalgia: Secondary | ICD-10-CM | POA: Insufficient documentation

## 2022-04-28 DIAGNOSIS — Z79899 Other long term (current) drug therapy: Secondary | ICD-10-CM | POA: Insufficient documentation

## 2022-04-28 DIAGNOSIS — R0781 Pleurodynia: Secondary | ICD-10-CM | POA: Insufficient documentation

## 2022-04-28 DIAGNOSIS — Y92410 Unspecified street and highway as the place of occurrence of the external cause: Secondary | ICD-10-CM | POA: Insufficient documentation

## 2022-04-28 DIAGNOSIS — M79604 Pain in right leg: Secondary | ICD-10-CM | POA: Insufficient documentation

## 2022-04-28 MED ORDER — OXYCODONE-ACETAMINOPHEN 5-325 MG PO TABS
2.0000 | ORAL_TABLET | Freq: Once | ORAL | Status: AC
  Administered 2022-04-28: 2 via ORAL
  Filled 2022-04-28: qty 2

## 2022-04-28 MED ORDER — METHOCARBAMOL 500 MG PO TABS: 500.0000 mg | ORAL_TABLET | Freq: Two times a day (BID) | ORAL | 0 refills | Status: AC
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
## 2022-04-28 NOTE — ED Notes (Signed)
Patient went outside with support person. Patient no longer seen outside
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
## 2022-04-28 NOTE — Discharge Instructions (Addendum)
It was a pleasure taking care of you today!   Your imaging in the emergency department earlier today was negative for acute fractures or dislocations. You will feel more sore in the morning. You are prescribed Robaxin (muscle relaxer). Do not drive or operate heavy machinery while taking the muscle relaxer. You may take over the counter 600 mg Ibuprofen every 6 hours or 1,000 mg Tylenol every 6 hours as needed for pain for no more than 7 days. You may apply ice or heat to affected area for up to 15 minutes at a time. Ensure to place a barrier between your skin and the ice/heat. Return to the Emergency Department if you are experiencing increasing/worsening rib pain, chest wall pain, trouble breathing, or worsening symptoms.
